# Patient Record
Sex: Female | Born: 1970 | Race: White | Hispanic: No | Marital: Married | Smoking: Current every day smoker
Health system: Southern US, Community
[De-identification: ages and names within clinical notes are randomized; demographics above are authoritative.]

## PROBLEM LIST (undated history)

## (undated) DIAGNOSIS — H8120 Vestibular neuronitis, unspecified ear: Secondary | ICD-10-CM

## (undated) DIAGNOSIS — R112 Nausea with vomiting, unspecified: Secondary | ICD-10-CM

## (undated) DIAGNOSIS — Z9889 Other specified postprocedural states: Secondary | ICD-10-CM

## (undated) DIAGNOSIS — R0981 Nasal congestion: Secondary | ICD-10-CM

## (undated) DIAGNOSIS — R42 Dizziness and giddiness: Secondary | ICD-10-CM

## (undated) HISTORY — PX: APPENDECTOMY: SHX54

## (undated) HISTORY — PX: TONSILLECTOMY: SUR1361

## (undated) HISTORY — PX: KNEE SURGERY: SHX244

## (undated) HISTORY — PX: NASAL SINUS SURGERY: SHX719

## (undated) HISTORY — PX: BREAST ENHANCEMENT SURGERY: SHX7

## (undated) HISTORY — PX: OTHER SURGICAL HISTORY: SHX169

---

## 2012-11-15 ENCOUNTER — Ambulatory Visit
Admission: RE | Admit: 2012-11-15 | Discharge: 2012-11-15 | Disposition: A | Discharge status: Home / Self Care | Payer: BC Managed Care – PPO | Source: Ambulatory Visit | Attending: Family Medicine | Admitting: Family Medicine

## 2012-11-15 ENCOUNTER — Emergency Department (HOSPITAL_COMMUNITY)
Admission: EM | Admit: 2012-11-15 | Discharge: 2012-11-15 | Discharge status: Against Medical Advice | Payer: BC Managed Care – PPO

## 2012-11-15 ENCOUNTER — Other Ambulatory Visit: Payer: Self-pay | Admitting: Family Medicine

## 2012-11-15 DIAGNOSIS — R102 Pelvic and perineal pain: Secondary | ICD-10-CM

## 2012-12-01 ENCOUNTER — Encounter (HOSPITAL_COMMUNITY): Payer: Self-pay | Admitting: Emergency Medicine

## 2012-12-01 ENCOUNTER — Emergency Department (HOSPITAL_COMMUNITY): Payer: BC Managed Care – PPO

## 2012-12-01 ENCOUNTER — Encounter (HOSPITAL_COMMUNITY): Payer: Self-pay

## 2012-12-01 ENCOUNTER — Emergency Department (HOSPITAL_COMMUNITY)
Admission: EM | Admit: 2012-12-01 | Discharge: 2012-12-01 | Disposition: A | Discharge status: Home / Self Care | Payer: BC Managed Care – PPO | Attending: Emergency Medicine | Admitting: Emergency Medicine

## 2012-12-01 ENCOUNTER — Observation Stay (HOSPITAL_COMMUNITY)
Admission: EM | Admit: 2012-12-01 | Discharge: 2012-12-02 | Disposition: A | Discharge status: Home / Self Care | Payer: BC Managed Care – PPO | Attending: Internal Medicine | Admitting: Internal Medicine

## 2012-12-01 DIAGNOSIS — J069 Acute upper respiratory infection, unspecified: Secondary | ICD-10-CM

## 2012-12-01 DIAGNOSIS — H538 Other visual disturbances: Secondary | ICD-10-CM | POA: Insufficient documentation

## 2012-12-01 DIAGNOSIS — Z79899 Other long term (current) drug therapy: Secondary | ICD-10-CM | POA: Insufficient documentation

## 2012-12-01 DIAGNOSIS — R110 Nausea: Secondary | ICD-10-CM | POA: Insufficient documentation

## 2012-12-01 DIAGNOSIS — H8120 Vestibular neuronitis, unspecified ear: Principal | ICD-10-CM | POA: Insufficient documentation

## 2012-12-01 DIAGNOSIS — H81399 Other peripheral vertigo, unspecified ear: Secondary | ICD-10-CM

## 2012-12-01 DIAGNOSIS — H8121 Vestibular neuronitis, right ear: Secondary | ICD-10-CM

## 2012-12-01 DIAGNOSIS — R42 Dizziness and giddiness: Secondary | ICD-10-CM

## 2012-12-01 DIAGNOSIS — F17200 Nicotine dependence, unspecified, uncomplicated: Secondary | ICD-10-CM

## 2012-12-01 HISTORY — DX: Nausea with vomiting, unspecified: Z98.890

## 2012-12-01 HISTORY — DX: Nasal congestion: R09.81

## 2012-12-01 HISTORY — DX: Nausea with vomiting, unspecified: R11.2

## 2012-12-01 HISTORY — DX: Vestibular neuronitis, unspecified ear: H81.20

## 2012-12-01 HISTORY — DX: Dizziness and giddiness: R42

## 2012-12-01 LAB — CBC WITH DIFFERENTIAL/PLATELET
Basophils Absolute: 0 10*3/uL (ref 0.0–0.1)
Basophils Relative: 1 % (ref 0–1)
Hemoglobin: 17 g/dL — ABNORMAL HIGH (ref 12.0–15.0)
MCHC: 34.6 g/dL (ref 30.0–36.0)
Monocytes Relative: 11 % (ref 3–12)
Neutro Abs: 3.9 10*3/uL (ref 1.7–7.7)
Neutrophils Relative %: 71 % (ref 43–77)

## 2012-12-01 LAB — COMPREHENSIVE METABOLIC PANEL
ALT: 20 U/L (ref 0–35)
AST: 19 U/L (ref 0–37)
Albumin: 3.4 g/dL — ABNORMAL LOW (ref 3.5–5.2)
Alkaline Phosphatase: 39 U/L (ref 39–117)
BUN: 13 mg/dL (ref 6–23)
Chloride: 104 mEq/L (ref 96–112)
Potassium: 3.7 mEq/L (ref 3.5–5.1)
Sodium: 139 mEq/L (ref 135–145)
Total Bilirubin: 0.5 mg/dL (ref 0.3–1.2)

## 2012-12-01 LAB — URINALYSIS, ROUTINE W REFLEX MICROSCOPIC
Bilirubin Urine: NEGATIVE
Glucose, UA: NEGATIVE mg/dL
Hgb urine dipstick: NEGATIVE
Ketones, ur: NEGATIVE mg/dL
Protein, ur: NEGATIVE mg/dL

## 2012-12-01 LAB — URINE MICROSCOPIC-ADD ON

## 2012-12-01 MED ORDER — SODIUM CHLORIDE 0.9 % IV SOLN: INTRAVENOUS | Status: DC

## 2012-12-01 MED ORDER — ONDANSETRON HCL 4 MG/2ML IJ SOLN
4.0000 mg | Freq: Once | INTRAMUSCULAR | Status: AC
  Administered 2012-12-01: 4 mg via INTRAVENOUS
  Filled 2012-12-01: qty 2

## 2012-12-01 MED ORDER — LORAZEPAM 2 MG/ML IJ SOLN
1.0000 mg | Freq: Once | INTRAMUSCULAR | Status: AC
  Administered 2012-12-01: 1 mg via INTRAVENOUS
  Filled 2012-12-01: qty 1

## 2012-12-01 MED ORDER — AMOXICILLIN-POT CLAVULANATE 400-57 MG/5ML PO SUSR
10.9000 mL | Freq: Two times a day (BID) | ORAL | Status: DC
Start: 2012-12-01 — End: 2012-12-01
  Administered 2012-12-01: 10.9 mL via ORAL
  Filled 2012-12-01: qty 10.9

## 2012-12-01 MED ORDER — SODIUM CHLORIDE 0.9 % IV SOLN
INTRAVENOUS | Status: AC
  Administered 2012-12-01: 18:00:00 via INTRAVENOUS

## 2012-12-01 MED ORDER — SODIUM CHLORIDE 0.9 % IV BOLUS (SEPSIS)
1000.0000 mL | Freq: Once | INTRAVENOUS | Status: AC
  Administered 2012-12-01: 1000 mL via INTRAVENOUS

## 2012-12-01 MED ORDER — MECLIZINE HCL 50 MG PO TABS: 50.0000 mg | ORAL_TABLET | Freq: Three times a day (TID) | ORAL | Status: AC | PRN

## 2012-12-01 MED ORDER — DIMENHYDRINATE 50 MG PO TABS
50.0000 mg | ORAL_TABLET | Freq: Four times a day (QID) | ORAL | Status: DC
  Administered 2012-12-01 – 2012-12-02 (×4): 50 mg via ORAL
  Filled 2012-12-01 (×7): qty 1

## 2012-12-01 MED ORDER — LORAZEPAM 2 MG/ML IJ SOLN: 1.0000 mg | Freq: Four times a day (QID) | INTRAMUSCULAR | Status: DC | PRN

## 2012-12-01 MED ORDER — ONDANSETRON HCL 4 MG/2ML IJ SOLN: 4.0000 mg | Freq: Three times a day (TID) | INTRAMUSCULAR | Status: DC | PRN

## 2012-12-01 MED ORDER — AMOXICILLIN-POT CLAVULANATE 400-57 MG/5ML PO SUSR
10.9000 mL | Freq: Two times a day (BID) | ORAL | Status: DC
  Administered 2012-12-01 – 2012-12-02 (×2): 10.9 mL via ORAL
  Filled 2012-12-01 (×3): qty 10.9

## 2012-12-01 MED ORDER — ALBUTEROL SULFATE (5 MG/ML) 0.5% IN NEBU: 2.5000 mg | INHALATION_SOLUTION | RESPIRATORY_TRACT | Status: DC | PRN

## 2012-12-01 MED ORDER — ONDANSETRON HCL 4 MG/2ML IJ SOLN: 4.0000 mg | Freq: Four times a day (QID) | INTRAMUSCULAR | Status: DC | PRN

## 2012-12-01 MED ORDER — LORAZEPAM 2 MG/ML IJ SOLN: 1.0000 mg | Freq: Once | INTRAMUSCULAR | Status: DC

## 2012-12-01 MED ORDER — AMOXICILLIN-POT CLAVULANATE 400-57 MG/5ML PO SUSR: 10.9000 mL | Freq: Once | ORAL | Status: DC

## 2012-12-01 MED ORDER — GUAIFENESIN ER 600 MG PO TB12
600.0000 mg | ORAL_TABLET | Freq: Two times a day (BID) | ORAL | Status: DC
  Administered 2012-12-01 – 2012-12-02 (×2): 600 mg via ORAL
  Filled 2012-12-01 (×3): qty 1

## 2012-12-01 MED ORDER — ACETAMINOPHEN 650 MG RE SUPP: 650.0000 mg | Freq: Four times a day (QID) | RECTAL | Status: DC | PRN

## 2012-12-01 MED ORDER — ENOXAPARIN SODIUM 40 MG/0.4ML ~~LOC~~ SOLN
40.0000 mg | SUBCUTANEOUS | Status: DC
  Administered 2012-12-01: 40 mg via SUBCUTANEOUS
  Filled 2012-12-01 (×2): qty 0.4

## 2012-12-01 MED ORDER — ACETAMINOPHEN 325 MG PO TABS: 650.0000 mg | ORAL_TABLET | Freq: Four times a day (QID) | ORAL | Status: DC | PRN

## 2012-12-01 MED ORDER — ONDANSETRON 4 MG PO TBDP: ORAL_TABLET | ORAL | Status: DC

## 2012-12-01 MED ORDER — MECLIZINE HCL 25 MG PO TABS
50.0000 mg | ORAL_TABLET | Freq: Once | ORAL | Status: AC
  Administered 2012-12-01: 50 mg via ORAL
  Filled 2012-12-01: qty 2

## 2012-12-01 MED ORDER — OXYMETAZOLINE HCL 0.05 % NA SOLN
2.0000 | Freq: Every day | NASAL | Status: DC | PRN
  Administered 2012-12-01: 2 via NASAL
  Filled 2012-12-01: qty 15

## 2012-12-01 MED ORDER — PREDNISONE 50 MG PO TABS
60.0000 mg | ORAL_TABLET | Freq: Every day | ORAL | Status: DC
  Administered 2012-12-01 – 2012-12-02 (×2): 60 mg via ORAL
  Filled 2012-12-01 (×4): qty 1

## 2012-12-01 MED ORDER — HYDROCODONE-ACETAMINOPHEN 5-325 MG PO TABS: 1.0000 | ORAL_TABLET | Freq: Four times a day (QID) | ORAL | Status: DC | PRN

## 2012-12-01 MED ORDER — ONDANSETRON HCL 4 MG PO TABS: 4.0000 mg | ORAL_TABLET | Freq: Four times a day (QID) | ORAL | Status: DC | PRN

## 2012-12-01 NOTE — ED Notes (Signed)
Bed: ZO10 Expected date: 12/01/12 Expected time: 7:07 AM Means of arrival: Ambulance Comments: Ear pain/cold sx

## 2012-12-01 NOTE — ED Provider Notes (Signed)
CSN: [id number]     Arrival date & time 12/01/12  4098 History   First MD Initiated Contact with Patient 12/01/12 [phone]     Chief Complaint  Patient presents with  . Dizziness    AT 6AM  . Nausea    AT 6AM   (Consider location/radiation/quality/duration/timing/severity/associated sxs/prior Treatment) HPI Patient with history of left-sided vestibular neuritis presents has had  6 days of URI type symptoms and woke this morning at 5:30 AM with acute onset vertigo. Patient states she was nauseated and felt as if the room were spinning. The dizziness was worse when she turned her head or changed positions. She states she had similar symptoms when she had vestibular neuritis. She has no focal weakness or sensory loss. She complains of right ear discomfort hands decreased hearing in the right ear. She's had a cough has been productive of yellow-green sputum. She has no shortness of breath or chest pain. Should not abdominal pain with vomiting. Patient saw her primary Dr. yesterday morning who told her that she look like she would have a developing right ear infection and start her on antibiotics. She is taking 2 doses of that. Patient was brought in by EMS. She was given IV Zofran with improvement of her nausea. Past Medical History  Diagnosis Date  . Vestibular neuronitis    Past Surgical History  Procedure Laterality Date  . Knee surgery     No family history on file. History  Substance Use Topics  . Smoking status: Not on file  . Smokeless tobacco: Not on file  . Alcohol Use: Not on file   OB History   Grav Para Term Preterm Abortions TAB SAB Ect Mult Living                 Review of Systems  Constitutional: Negative for fever and chills.  HENT: Positive for ear pain, congestion and rhinorrhea. Negative for sore throat, neck pain, neck stiffness and sinus pressure.   Eyes: Positive for visual disturbance. Negative for photophobia.  Respiratory: Positive for cough. Negative for  shortness of breath and wheezing.   Cardiovascular: Negative for chest pain, palpitations and leg swelling.  Gastrointestinal: Positive for nausea. Negative for vomiting, abdominal pain and diarrhea.  Genitourinary: Negative for dysuria.  Musculoskeletal: Negative for myalgias, back pain and gait problem.  Skin: Negative for pallor, rash and wound.  Neurological: Positive for dizziness. Negative for syncope, weakness, light-headedness, numbness and headaches.  All other systems reviewed and are negative.    Allergies  Sulfa antibiotics and Brussels sprouts  Home Medications   Current Outpatient Rx  Name  Route  Sig  Dispense  Refill  . amoxicillin-clavulanate (AUGMENTIN) 875-125 MG per tablet   Oral   Take 1 tablet by mouth 2 (two) times daily. For 10 days         . dimenhyDRINATE (DRAMAMINE) 50 MG tablet   Oral   Take 50 mg by mouth every 8 (eight) hours as needed.         Marland Kitchen guaiFENesin (MUCINEX) 600 MG 12 hr tablet   Oral   Take 600 mg by mouth 2 (two) times daily.         Marland Kitchen HYDROcodone-acetaminophen (NORCO/VICODIN) 5-325 MG per tablet   Oral   Take 1 tablet by mouth every 6 (six) hours as needed for pain.         Marland Kitchen oxymetazoline (AFRIN) 0.05 % nasal spray   Nasal   Place 2 sprays into the nose  daily as needed for congestion (for 3 days).         . promethazine (PHENERGAN) 25 MG tablet   Oral   Take 25 mg by mouth every 6 (six) hours as needed for nausea.         . meclizine (ANTIVERT) 50 MG tablet   Oral   Take 1 tablet (50 mg total) by mouth 3 (three) times daily as needed for dizziness.   30 tablet   0   . ondansetron (ZOFRAN ODT) 4 MG disintegrating tablet      4mg  ODT q4 hours prn nausea/vomit   8 tablet   0    BP 138/82  Pulse 89  Temp(Src) 98.2 F (36.8 C) (Oral)  Resp 18  SpO2 95% Physical Exam  Nursing note and vitals reviewed. Constitutional: She is oriented to person, place, and time. She appears well-developed and  well-nourished. No distress.  HENT:  Head: Normocephalic and atraumatic.  Mouth/Throat: Oropharynx is clear and moist. No oropharyngeal exudate.  No sinus tenderness to palpation. No mastoid tenderness. Patient has a bulging erythematous right TM compared to left.  Eyes: EOM are normal. Pupils are equal, round, and reactive to light.  Patient has a fatigable horizontal nystagmus. It is exacerbated by change in head position.  Neck: Normal range of motion. Neck supple.  Patient has no nuchal rigidity   Cardiovascular: Normal rate and regular rhythm.   Pulmonary/Chest: Effort normal and breath sounds normal. No respiratory distress. She has no wheezes. She has no rales. She exhibits no tenderness.  Abdominal: Soft. Bowel sounds are normal. She exhibits no distension and no mass. There is no tenderness. There is no rebound and no guarding.  Musculoskeletal: Normal range of motion. She exhibits no edema and no tenderness.  No calf swelling or tenderness.  Neurological: She is alert and oriented to person, place, and time.  Patient is alert and oriented x3 with clear, goal oriented speech. Patient has 5/5 motor in all extremities. Sensation is intact to light touch. Bilateral finger-to-nose is normal with no signs of dysmetria.    Skin: Skin is warm and dry. No rash noted. No erythema.  Psychiatric: She has a normal mood and affect. Her behavior is normal.    ED Course  Procedures (including critical care time) Labs Review Labs Reviewed  CBC WITH DIFFERENTIAL - Abnormal; Notable for the following:    RBC 5.20 (*)    Hemoglobin 17.0 (*)    HCT 49.2 (*)    All other components within normal limits  COMPREHENSIVE METABOLIC PANEL - Abnormal; Notable for the following:    Albumin 3.4 (*)    All other components within normal limits  URINALYSIS, ROUTINE W REFLEX MICROSCOPIC - Abnormal; Notable for the following:    APPearance CLOUDY (*)    Leukocytes, UA TRACE (*)    All other components  within normal limits  URINE MICROSCOPIC-ADD ON   Imaging Review Dg Chest 2 View  12/01/2012   *RADIOLOGY REPORT*  Clinical Data: Productive cough.  CHEST - 2 VIEW  Comparison: 11/21/2016 2014  Findings: The heart size and pulmonary vascularity are normal. There is slight linear atelectasis or scarring at the right lung base.  No acute osseous abnormality.  IMPRESSION: Minimal linear atelectasis or scarring at the right base posteriorly.   Original Report Authenticated By: Francene Boyers, M.D.    MDM  Given the patient's history vestibular neuritis, recent URI symptoms, normal neurologic exam, Ibelieve this likely represents an episode of peripheral  vertigo. We'll screen with labs, we'll get a chest x-ray to rule out pneumonia, and treat symptomatically.  Patient states she is feeling much better after the medications. She's had no further nausea and emergency department.  Patient condition proven emergency department. And related to the bathroom with mild assistance from her husband she states that she felt much more steady. Is agreeable with plan to discharge home with by mouth antibiotics and for vertigo the medication. Patient advised to followup with the ENT. Return precautions been given.  Loren Racer, MD 12/01/12 1036

## 2012-12-01 NOTE — ED Notes (Signed)
MD at bedside. 

## 2012-12-01 NOTE — ED Provider Notes (Signed)
CSN: [id number]     Arrival date & time 12/01/12  1402 History   First MD Initiated Contact with Patient 12/01/12 1424     Chief Complaint  Patient presents with  . Dizziness   (Consider location/radiation/quality/duration/timing/severity/associated sxs/prior Treatment) HPI Patient was seen by me earlier today for vertiginous symptoms that started acutely this morning. Her symptoms and significantly improved and she was ambulating in the emergency department. She was discharged home with ODT Zofran and meclizine. Patient states that at 12 a clock should she had again another sudden episode of vertigo with severe nausea. She took the medication as prescribed and stated "it did nothing". Patient has no neurologic complaints. Past Medical History  Diagnosis Date  . Vestibular neuronitis    Past Surgical History  Procedure Laterality Date  . Knee surgery     No family history on file. History  Substance Use Topics  . Smoking status: Current Every Day Smoker  . Smokeless tobacco: Not on file  . Alcohol Use: Not on file   OB History   Grav Para Term Preterm Abortions TAB SAB Ect Mult Living                 Review of Systems  Constitutional: Negative for fever and chills.  HENT: Positive for hearing loss and ear pain. Negative for sore throat and neck pain.   Eyes: Positive for visual disturbance.  Respiratory: Negative for shortness of breath.   Cardiovascular: Negative for chest pain.  Gastrointestinal: Positive for nausea and vomiting. Negative for abdominal pain, diarrhea and constipation.  Musculoskeletal: Negative for myalgias, back pain, joint swelling, arthralgias and gait problem.  Skin: Negative for pallor, rash and wound.  Neurological: Positive for dizziness. Negative for weakness, light-headedness, numbness and headaches.  All other systems reviewed and are negative.    Allergies  Sulfa antibiotics and Brussels sprouts  Home Medications   Current Outpatient Rx   Name  Route  Sig  Dispense  Refill  . amoxicillin-clavulanate (AUGMENTIN) 875-125 MG per tablet   Oral   Take 1 tablet by mouth 2 (two) times daily. For 10 days         . dimenhyDRINATE (DRAMAMINE) 50 MG tablet   Oral   Take 50 mg by mouth every 8 (eight) hours as needed.         Marland Kitchen guaiFENesin (MUCINEX) 600 MG 12 hr tablet   Oral   Take 600 mg by mouth 2 (two) times daily.         Marland Kitchen HYDROcodone-acetaminophen (NORCO/VICODIN) 5-325 MG per tablet   Oral   Take 1 tablet by mouth every 6 (six) hours as needed for pain.         . meclizine (ANTIVERT) 50 MG tablet   Oral   Take 1 tablet (50 mg total) by mouth 3 (three) times daily as needed for dizziness.   30 tablet   0   . ondansetron (ZOFRAN ODT) 4 MG disintegrating tablet      4mg  ODT q4 hours prn nausea/vomit   8 tablet   0   . oxymetazoline (AFRIN) 0.05 % nasal spray   Nasal   Place 2 sprays into the nose daily as needed for congestion (for 3 days).         . promethazine (PHENERGAN) 25 MG tablet   Oral   Take 25 mg by mouth every 6 (six) hours as needed for nausea.          BP 121/74  Pulse  84  Temp(Src) 98.8 F (37.1 C) (Oral)  Resp 18  SpO2 100% Physical Exam  Nursing note and vitals reviewed. Constitutional: She is oriented to person, place, and time. She appears well-developed and well-nourished.  Patient is nauseated  HENT:  Head: Normocephalic and atraumatic.  Mouth/Throat: Oropharynx is clear and moist.  Eyes: EOM are normal. Pupils are equal, round, and reactive to light.  Fatigable horizontal nystagmus worsened with position change.  Neck: Normal range of motion. Neck supple.  No meningeal symptoms  Cardiovascular: Normal rate and regular rhythm.   Pulmonary/Chest: Effort normal and breath sounds normal. No respiratory distress. She has no wheezes. She has no rales.  Abdominal: Soft. Bowel sounds are normal. She exhibits no distension and no mass. There is no tenderness. There is no  rebound and no guarding.  Musculoskeletal: Normal range of motion. She exhibits no edema and no tenderness.  Neurological: She is alert and oriented to person, place, and time.  Patient is alert and oriented x3 with clear, goal oriented speech. Patient has 5/5 motor in all extremities. Sensation is intact to light touch. Bilateral finger-to-nose is normal with no signs of dysmetria.   Skin: Skin is warm and dry. No rash noted. No erythema.  Psychiatric: She has a normal mood and affect. Her behavior is normal.    ED Course  Procedures (including critical care time) Labs Review Labs Reviewed - No data to display Imaging Review Dg Chest 2 View  12/01/2012   *RADIOLOGY REPORT*  Clinical Data: Productive cough.  CHEST - 2 VIEW  Comparison: 11/21/2016 2014  Findings: The heart size and pulmonary vascularity are normal. There is slight linear atelectasis or scarring at the right lung base.  No acute osseous abnormality.  IMPRESSION: Minimal linear atelectasis or scarring at the right base posteriorly.   Original Report Authenticated By: Francene Boyers, M.D.    MDM  Place IV give IV medications and fluids. The or MRI given persistent symptoms. Discussed with [facility] for admission.  [facility] to admit to medical floor. MRI is pending.  Loren Racer, MD 12/01/12 [phone]

## 2012-12-01 NOTE — Progress Notes (Signed)
pcp is elizabeth dewey per pt epic updated

## 2012-12-01 NOTE — Progress Notes (Signed)
Received call from [facility] at [facility] aid pharmacy regarding patient's discharge prescription of meclizine. Reports that they only carry meclizine 12.5 mg and 25 mg tablets but prescription was for 50 mg. EDCM spoke with J.Palmer, PA in fast track who changed prescription to meclizine to 25mg  2 tablets by mouth up to three times a day as needed. Dispense 30 pills. Information relayed to [place] at [facility]. No further needs identified.

## 2012-12-01 NOTE — ED Notes (Signed)
Patient transported to MRI 

## 2012-12-01 NOTE — ED Notes (Signed)
Report given to [place] on 5 east. Will see if MRI staff will tx to the floor once complete.

## 2012-12-01 NOTE — ED Notes (Signed)
Per pt, left ED this am with same symptoms-went home and states meds not working at all-took two doses of meclazine with no relief

## 2012-12-01 NOTE — ED Notes (Signed)
MRI refused to tx pt to the floor. Will wait for pt to arrive back in her room and then transfer to 1513.

## 2012-12-01 NOTE — ED Notes (Signed)
Per [facility] pt c/o of vestibular issues to the left side (ear) in January. Antibiotics given.  HX of the same complaints today. Nausea relieved with zofran IVP. Recent left knee surgery -

## 2012-12-01 NOTE — H&P (Addendum)
[facility] History and Physical  Donna Copeland RUE:[id number] DOB: 04-10-1970 DOA: 12/01/2012   Referring physician: Dr. Loren Racer, EDP PCP: Dr. Maryelizabeth Rowan Outpatient Specialists:  1. Orthopedics: Dr. Gean Birchwood  Chief Complaint: Dizziness & nausea  HPI: Donna Copeland is a 42 y.o. female with history of left vestibular neuronitis, tobacco abuse, recent left knee arthroscopic surgery, presented to the ED for the second time on 12/01/12 with complaints of severe dizziness/vertigo, unsteady gait and nausea. She has prior history vestibular neuronitis of left ear in January 2014 when she had similar presentation and was hospitalized in [place]. She had prolonged rehabilitation post discharge and her left ear continues to be "sensitive". During subsequent followup with ENT, she was informed that she had "permanent damage" with her inner ear apparatus. 5 days ago she was exposed to her spouse and son with "cold symptoms". She subsequently started having nasal congestion, cough with intermittent purulent sputum and dyspnea but no fever or chills. She also started experiencing stuffiness of both ears. She was seen by her PCP 2 days ago and started on oral Augmentin for "inflamed membranes". She was not able to tolerate the large Augmentin pills. Last night while in the shower, she abruptly started having vertigo and everything around her started spinning rapidly, associated with unsteady gait, stuffy right ear with occasional mild pain but no real hearing deficit and extreme nausea. No falls. No vomiting. She states that this presentation was very similar to her prior episode in January. She was seen in the ED and discharged home on Dramamine, continued Augmentin and Phenergan. She however returned with persistent severe symptoms and the hospitalist were requested to admit for further management. In the ED, CBC, CMP, UA were unremarkable. MRI brain shows features of  sinusitis.   Review of Systems: All systems reviewed and apart from history of presenting illness, are negative   Past Medical History  Diagnosis Date  . Vestibular neuronitis   . Sinus congestion   . Vertigo   . PONV (postoperative nausea and vomiting)    Past Surgical History  Procedure Laterality Date  . Knee surgery    . Left knee arthroscopy    . Nasal sinus surgery      march 2014  . Appendectomy    . Tonsillectomy    . Breast enhancement surgery     Social History:  reports that she has been smoking Cigarettes.  She has been smoking about 0.50 packs per day. She has never used smokeless tobacco. She reports that she drinks about 2.4 ounces of alcohol per week. Her drug history is not on file. Married. Independent of activities of daily living.  Allergies  Allergen Reactions  . Sulfa Antibiotics Hives, Shortness Of Breath and Nausea And Vomiting  . Brussels Sprouts [Brassica Oleracea Italica] Nausea And Vomiting    Family History  Problem Relation Age of Onset  . Adopted: Yes   since she was adopted at age 65, she does not know her family history.  Prior to Admission medications   Medication Sig Start Date End Date Taking? Authorizing Provider  amoxicillin-clavulanate (AUGMENTIN) 875-125 MG per tablet Take 1 tablet by mouth 2 (two) times daily. For 10 days   Yes Historical Provider, MD  dimenhyDRINATE (DRAMAMINE) 50 MG tablet Take 50 mg by mouth every 8 (eight) hours as needed.   Yes Historical Provider, MD  guaiFENesin (MUCINEX) 600 MG 12 hr tablet Take 600 mg by mouth 2 (two) times daily.   Yes Historical Provider, MD  HYDROcodone-acetaminophen (NORCO/VICODIN) 5-325 MG per tablet Take 1 tablet by mouth every 6 (six) hours as needed for pain.   Yes Historical Provider, MD  meclizine (ANTIVERT) 50 MG tablet Take 1 tablet (50 mg total) by mouth 3 (three) times daily as needed for dizziness. 12/01/12  Yes Loren Racer, MD  ondansetron (ZOFRAN-ODT) 4 MG disintegrating  tablet Take 4 mg by mouth every 8 (eight) hours as needed for nausea (or vomiting).   Yes Historical Provider, MD  oxymetazoline (AFRIN) 0.05 % nasal spray Place 2 sprays into the nose daily as needed for congestion (for 3 days).   Yes Historical Provider, MD  promethazine (PHENERGAN) 25 MG tablet Take 25 mg by mouth every 6 (six) hours as needed for nausea.   Yes Historical Provider, MD   Physical Exam: Filed Vitals:   12/01/12 1413 12/01/12 1530  BP: 121/74 128/77  Pulse: 84 96  Temp: 98.8 F (37.1 C) 98.5 F (36.9 C)  TempSrc: Oral Oral  Resp: 18 20  SpO2: 100% 98%     General exam: Moderately built and nourished female patient, lying comfortably supine in bed in no obvious distress.  Head, eyes and ENT: Nontraumatic and normocephalic. Pupils equally reacting to light and accommodation. Oral mucosa slightly dry. Mild hyperemia of right tympanic membrane which appears dull. Mild cerumen left auditory canal.? Mild horizontal nystagmus to right gaze.  Neck: Supple. No JVD, carotid bruit or thyromegaly.  Lymphatics: No lymphadenopathy.  Respiratory system: Clear to auscultation. No increased work of breathing.  Cardiovascular system: S1 and S2 heard, RRR. No JVD, murmurs, gallops, clicks or pedal edema.  Gastrointestinal system: Abdomen is nondistended, soft and nontender. Normal bowel sounds heard. No organomegaly or masses appreciated.  Central nervous system: Alert and oriented. No focal neurological deficits.  Extremities: Symmetric 5 x 5 power. Peripheral pulses symmetrically felt.  Skin: No rashes or acute findings.  Musculoskeletal system: Negative exam.  Psychiatry: Pleasant and cooperative.   Labs on Admission:  Basic Metabolic Panel:  Recent Labs Lab 12/01/12 0800  NA 139  K 3.7  CL 104  CO2 26  GLUCOSE 96  BUN 13  CREATININE 0.79  CALCIUM 9.1   Liver Function Tests:  Recent Labs Lab 12/01/12 0800  AST 19  ALT 20  ALKPHOS 39  BILITOT 0.5   PROT 6.9  ALBUMIN 3.4*   No results found for this basename: LIPASE, AMYLASE,  in the last 168 hours No results found for this basename: AMMONIA,  in the last 168 hours CBC:  Recent Labs Lab 12/01/12 0800  WBC 5.6  NEUTROABS 3.9  HGB 17.0*  HCT 49.2*  MCV 94.6  PLT 174   Cardiac Enzymes: No results found for this basename: CKTOTAL, CKMB, CKMBINDEX, TROPONINI,  in the last 168 hours  BNP (last 3 results) No results found for this basename: PROBNP,  in the last 8760 hours CBG: No results found for this basename: GLUCAP,  in the last 168 hours  Radiological Exams on Admission: Dg Chest 2 View  12/01/2012   *RADIOLOGY REPORT*  Clinical Data: Productive cough.  CHEST - 2 VIEW  Comparison: 11/21/2016 2014  Findings: The heart size and pulmonary vascularity are normal. There is slight linear atelectasis or scarring at the right lung base.  No acute osseous abnormality.  IMPRESSION: Minimal linear atelectasis or scarring at the right base posteriorly.   Original Report Authenticated By: Francene Boyers, M.D.   Mr Brain Wo Contrast  12/01/2012   CLINICAL DATA:  42 year old female  with dizziness, nausea, headaches.  EXAM: MRI HEAD WITHOUT CONTRAST  TECHNIQUE: Multiplanar, multisequence MR imaging was performed. No intravenous contrast was administered.  COMPARISON:  None.  FINDINGS: Cerebral volume is normal. No restricted diffusion to suggest acute infarction. No midline shift, mass effect, evidence of mass lesion, ventriculomegaly, extra-axial collection or acute intracranial hemorrhage. Cervicomedullary junction and pituitary are within normal limits. Negative visualized cervical spine. Major intracranial vascular flow voids are preserved. Wallace Cullens and white matter signal is within normal limits throughout the brain.  Visualized bone marrow signal is within normal limits. Small maxillary sinus mucous retention cysts. Moderate ethmoid and sphenoid sinus mucosal thickening. Left sphenoid air-fluid  level. Frontal sinuses are relatively clear. Trace bilateral mastoid fluid. Negative visualized nasopharynx. Otherwise grossly normal visualized internal auditory structures. Negative scalp soft tissues.  IMPRESSION: 1. Normal non contrast MRI appearance of the brain.  2. Moderate paranasal sinus and mild mastoid inflammatory changes.   Electronically Signed   By: Augusto Gamble M.D.   On: 12/01/2012 15:47     Assessment/Plan Active Problems:   Vertigo   Tobacco use disorder   Vestibular neuronitis   Right Vestibular neuronitis/vertigo/Possible sinusitis - MRI brain without acute changes. - Treat with oral prednisone taper for 10 days, continue oral Augmentin but in suspension form, dimenhydrinate scheduled & Ativan when necessary. When necessary IV Zofran for nausea. Brief IV fluid hydration. - OT evaluation - Discussed with ENT on call Dr. Christia Reading who agrees with above management and he will be happy to see patient as an OP for further evaluation. Discussed with spouse who is agreeable to this plan.  Tobacco abuse - Cessation counseled.    Code Status: Full   Family Communication: Discussed with spouse at bedside.  Disposition Plan: Home when medically stable.   Time spent: 55 minutes  [facility] Pager [phone]  If 7PM-7AM, please contact night-coverage [email or web address] Password [facility] 12/01/2012, 5:39 PM

## 2012-12-02 MED ORDER — AMOXICILLIN-POT CLAVULANATE 400-57 MG/5ML PO SUSR: 10.9000 mL | Freq: Two times a day (BID) | ORAL | Status: AC

## 2012-12-02 MED ORDER — GUAIFENESIN ER 600 MG PO TB12: 600.0000 mg | ORAL_TABLET | Freq: Two times a day (BID) | ORAL | Status: AC

## 2012-12-02 MED ORDER — PREDNISONE 20 MG PO TABS: ORAL_TABLET | ORAL | Status: AC

## 2012-12-02 NOTE — Progress Notes (Signed)
OT Note  Patient Details Name: Nekesha Font MRN: [id number] DOB: 11/12/1970        Pt states she is being DCed today.  Recomend pt follow up at [facility]. Pt agreeable.   Pt states she was also having PT for her knee.  Pt open to also having PT for her vestibular issue- but would like to go to same place.    Alba Cory 12/02/2012, 11:33 AM

## 2012-12-02 NOTE — Discharge Summary (Signed)
Physician Discharge Summary  Donna Copeland ZOX:[id number] DOB: 10-18-1970 DOA: 12/01/2012  PCP: Provider Not In System  Admit date: 12/01/2012 Discharge date: 12/02/2012  Time spent: 35 minutes  Recommendations for Outpatient Follow-up:  1. Vestibular PT eval.  2. Follow up with ENT 3. Follow up with PCP in 1 -2 weeks.   Discharge Diagnoses:  Active Problems:   Vertigo   Tobacco use disorder   Vestibular neuronitis   Discharge Condition: improved  Diet recommendation: regular  Filed Weights   12/01/12 1900  Weight: 60.102 kg (132 lb 8 oz)    History of present illness:  Donna Copeland is a 42 y.o. female with history of left vestibular neuronitis, tobacco abuse, recent left knee arthroscopic surgery, presented to the ED for the second time on 12/01/12 with complaints of severe dizziness/vertigo, unsteady gait and nausea. She has prior history vestibular neuronitis of left ear in January 2014 when she had similar presentation and was hospitalized in [place]. She had prolonged rehabilitation post discharge and her left ear continues to be "sensitive". During subsequent followup with ENT, she was informed that she had "permanent damage" with her inner ear apparatus. 5 days ago she was exposed to her spouse and son with "cold symptoms". She subsequently started having nasal congestion, cough with intermittent purulent sputum and dyspnea but no fever or chills. She also started experiencing stuffiness of both ears. She was seen by her PCP 2 days ago and started on oral Augmentin for "inflamed membranes". She was not able to tolerate the large Augmentin pills. She was seen in the ED and discharged home on Dramamine, continued Augmentin and Phenergan. She however returned with persistent severe symptoms and the hospitalist were requested to admit for further management. In the ED, CBC, CMP, UA were unremarkable. MRI brain shows features of sinusitis. Her symptoms improved within 24 hours  on prednisone and liquid augmentin. PT vestibular eval ordered and recommended outpatient therapy. She is being discharged on tapering dose of prednisone.     Hospital Course:  Right Vestibular neuronitis/vertigo/Possible sinusitis  - MRI brain without acute changes.  - Treat with oral prednisone taper for 10 days, continue oral Augmentin but in suspension form, dimenhydrinate scheduled & Ativan when necessary.- OT evaluation recommended outpatient follow up.  - Discussed with ENT on call Dr. Christia Reading who agrees with above management and he will be happy to see patient as an OP for further evaluation. Discussed with spouse who is agreeable to this plan. And plan to see the patient Monday afternoon.  Tobacco abuse  - Cessation counseled.  Procedures:  none  Consultations:  none  Discharge Exam: Filed Vitals:   12/02/12 1305  BP: 130/84  Pulse: 90  Temp: 98.2 F (36.8 C)  Resp: 18    General: alert afebrile comfortable Cardiovascular: s1s2 Respiratory: ctab  Discharge Instructions  Discharge Orders   Future Orders Complete By Expires   Discharge instructions  As directed    Comments:     Follow up with Dr Jenne Pane on Monday.   Increase activity slowly  As directed        Medication List    STOP taking these medications       amoxicillin-clavulanate 875-125 MG per tablet  Commonly known as:  AUGMENTIN  Replaced by:  amoxicillin-clavulanate 400-57 MG/5ML suspension      TAKE these medications       amoxicillin-clavulanate 400-57 MG/5ML suspension  Commonly known as:  AUGMENTIN  Take 10.9 mLs by mouth every 12 (twelve)  hours.     DRAMAMINE 50 MG tablet  Generic drug:  dimenhyDRINATE  Take 50 mg by mouth every 8 (eight) hours as needed.     guaiFENesin 600 MG 12 hr tablet  Commonly known as:  MUCINEX  Take 1 tablet (600 mg total) by mouth 2 (two) times daily.     HYDROcodone-acetaminophen 5-325 MG per tablet  Commonly known as:  NORCO/VICODIN  Take 1  tablet by mouth every 6 (six) hours as needed for pain.     meclizine 50 MG tablet  Commonly known as:  ANTIVERT  Take 1 tablet (50 mg total) by mouth 3 (three) times daily as needed for dizziness.     ondansetron 4 MG disintegrating tablet  Commonly known as:  ZOFRAN-ODT  Take 4 mg by mouth every 8 (eight) hours as needed for nausea (or vomiting).     oxymetazoline 0.05 % nasal spray  Commonly known as:  AFRIN  Place 2 sprays into the nose daily as needed for congestion (for 3 days).     predniSONE 20 MG tablet  Commonly known as:  DELTASONE  - Prednisone 60 mg daily for 4 days followed by  - Prednisone 40 mg daily for 1 days followed by  - Prednisone 30 mg daily for 1 day followed by  - Prednisone 20 mg daily for 1 day followed by  - Prednisone 10 mg daily for 1 day followed by   - Prednisone 5 mg daily for 1 day.     promethazine 25 MG tablet  Commonly known as:  PHENERGAN  Take 25 mg by mouth every 6 (six) hours as needed for nausea.       Allergies  Allergen Reactions  . Sulfa Antibiotics Hives, Shortness Of Breath and Nausea And Vomiting  . Brussels Sprouts [Brassica Oleracea Italica] Nausea And Vomiting      The results of significant diagnostics from this hospitalization (including imaging, microbiology, ancillary and laboratory) are listed below for reference.    Significant Diagnostic Studies: Dg Chest 2 View  12/01/2012   *RADIOLOGY REPORT*  Clinical Data: Productive cough.  CHEST - 2 VIEW  Comparison: 11/21/2016 2014  Findings: The heart size and pulmonary vascularity are normal. There is slight linear atelectasis or scarring at the right lung base.  No acute osseous abnormality.  IMPRESSION: Minimal linear atelectasis or scarring at the right base posteriorly.   Original Report Authenticated By: Francene Boyers, M.D.   Mr Brain Wo Contrast  12/01/2012   CLINICAL DATA:  42 year old female with dizziness, nausea, headaches.  EXAM: MRI HEAD WITHOUT CONTRAST   TECHNIQUE: Multiplanar, multisequence MR imaging was performed. No intravenous contrast was administered.  COMPARISON:  None.  FINDINGS: Cerebral volume is normal. No restricted diffusion to suggest acute infarction. No midline shift, mass effect, evidence of mass lesion, ventriculomegaly, extra-axial collection or acute intracranial hemorrhage. Cervicomedullary junction and pituitary are within normal limits. Negative visualized cervical spine. Major intracranial vascular flow voids are preserved. Wallace Cullens and white matter signal is within normal limits throughout the brain.  Visualized bone marrow signal is within normal limits. Small maxillary sinus mucous retention cysts. Moderate ethmoid and sphenoid sinus mucosal thickening. Left sphenoid air-fluid level. Frontal sinuses are relatively clear. Trace bilateral mastoid fluid. Negative visualized nasopharynx. Otherwise grossly normal visualized internal auditory structures. Negative scalp soft tissues.  IMPRESSION: 1. Normal non contrast MRI appearance of the brain.  2. Moderate paranasal sinus and mild mastoid inflammatory changes.   Electronically Signed   By: Nedra Hai  Margo Aye M.D.   On: 12/01/2012 15:47   US Transvaginal Non-ob  11/29/2012   CLINICAL DATA:  Progressive chronic pelvic pain. Postmenopausal female on hormone replacement therapy.  EXAM: TRANSABDOMINAL AND TRANSVAGINAL ULTRASOUND OF PELVIS  TECHNIQUE: Both transabdominal and transvaginal ultrasound examinations of the pelvis were performed. Transabdominal technique was performed for global imaging of the pelvis including uterus, ovaries, adnexal regions, and pelvic cul-de-sac. It was necessary to proceed with endovaginal exam following the transabdominal exam to visualize the endometrium and ovaries .  COMPARISON:  None  FINDINGS: Uterus  Measurements: 11.7 by 5.8 by 6.7 cm. Retroverted. Several tiny uterine fibroids are identified, largest measuring 1.6 cm.  Endometrium  Thickness: 3 mm No focal  abnormality visualized.  Right ovary  Measurements: 2.4 x 1.9 x 2.3 cm. Normal postmenopausal appearance.  Left ovary  Measurements: 2.6 x 0.9 x 1.2 cm. Normal postmenopausal appearance.  Other findings  No free fluid.  IMPRESSION: Retroverted uterus, with several tiny uterine fibroids, largest measuring 1.6 cm.  Normal postmenopausal ovaries. No adnexal mass or free fluid identified.   Electronically Signed   By: Myles Rosenthal   On: 11/29/2012 09:27   US Pelvis Complete  11/15/2012   CLINICAL DATA:  Progressive chronic pelvic pain. Postmenopausal female on hormone replacement therapy.  EXAM: TRANSABDOMINAL AND TRANSVAGINAL ULTRASOUND OF PELVIS  TECHNIQUE: Both transabdominal and transvaginal ultrasound examinations of the pelvis were performed. Transabdominal technique was performed for global imaging of the pelvis including uterus, ovaries, adnexal regions, and pelvic cul-de-sac. It was necessary to proceed with endovaginal exam following the transabdominal exam to visualize the endometrium and ovaries.  COMPARISON:  None  FINDINGS: Uterus  Measurements: 11.7 by 5.8 by 6.7 cm. Retroverted. Several tiny uterine fibroids are identified, largest measuring 1.6 cm.  Endometrium  Thickness: 3 mm  No focal abnormality visualized.  Right ovary  Measurements: 2.4 x 1.9 x 2.3 cm.  Normal postmenopausal appearance.  Left ovary  Measurements: 2.6 x 0.9 x 1.2 cm. Normal postmenopausal appearance.  Other findings  No free fluid.  IMPRESSION: Retroverted uterus, with several tiny uterine fibroids, largest measuring 1.6 cm.  Normal postmenopausal ovaries. No adnexal mass or free fluid identified.   Electronically Signed   By: Myles Rosenthal   On: 11/15/2012 13:45    Microbiology: No results found for this or any previous visit (from the past 240 hour(s)).   Labs: Basic Metabolic Panel:  Recent Labs Lab 12/01/12 0800  NA 139  K 3.7  CL 104  CO2 26  GLUCOSE 96  BUN 13  CREATININE 0.79  CALCIUM 9.1   Liver Function  Tests:  Recent Labs Lab 12/01/12 0800  AST 19  ALT 20  ALKPHOS 39  BILITOT 0.5  PROT 6.9  ALBUMIN 3.4*   No results found for this basename: LIPASE, AMYLASE,  in the last 168 hours No results found for this basename: AMMONIA,  in the last 168 hours CBC:  Recent Labs Lab 12/01/12 0800  WBC 5.6  NEUTROABS 3.9  HGB 17.0*  HCT 49.2*  MCV 94.6  PLT 174   Cardiac Enzymes: No results found for this basename: CKTOTAL, CKMB, CKMBINDEX, TROPONINI,  in the last 168 hours BNP: BNP (last 3 results) No results found for this basename: PROBNP,  in the last 8760 hours CBG: No results found for this basename: GLUCAP,  in the last 168 hours     Signed:  Osiel Stick  [facility] 12/02/2012, 2:21 PM

## 2012-12-02 NOTE — Progress Notes (Signed)
PTNote  Patient Details Name: Donna Copeland MRN: [id number] DOB: [birth year]  Chart reviewed and interviewed pt. At this time no further vestibular assessment or or treatment needed. Pt reports no increase in symptoms with rolling or movement, just persistent when she is acutely having symptoms. At this time she reports she is feeling much better and was able to turn head and talk with no reports of dizziness. With this neuritis , she is to begin gaze stabilization exercises when symptoms begin to calm down and she is independent with these and fully aware of how to preform . She showed me how to do them and how to progress them correctly. I did let her know and gave her a handout of the vestibular OP PT program that specializes in treatment and assessment of vestibular problems if she and her MD feels she would need to pursue this later.      No further PT needed at this time. Thank you for the referral, call if any questions.    Marella Bile 12/02/2012, 1:13 PM  Marella Bile, PT Pager: [phone] 12/02/2012

## 2016-02-13 ENCOUNTER — Other Ambulatory Visit: Payer: Self-pay | Admitting: Family Medicine

## 2016-02-13 DIAGNOSIS — Z1231 Encounter for screening mammogram for malignant neoplasm of breast: Secondary | ICD-10-CM

## 2016-03-12 ENCOUNTER — Ambulatory Visit: Payer: Self-pay

## 2017-01-28 ENCOUNTER — Other Ambulatory Visit: Payer: Self-pay

## 2017-01-28 ENCOUNTER — Encounter (HOSPITAL_COMMUNITY): Payer: Self-pay | Admitting: Emergency Medicine

## 2017-01-28 ENCOUNTER — Emergency Department (HOSPITAL_COMMUNITY)
Admission: EM | Admit: 2017-01-28 | Discharge: 2017-01-28 | Disposition: A | Discharge status: Home / Self Care | Payer: 59 | Attending: Emergency Medicine | Admitting: Emergency Medicine

## 2017-01-28 ENCOUNTER — Emergency Department (HOSPITAL_COMMUNITY): Payer: 59

## 2017-01-28 DIAGNOSIS — Z79899 Other long term (current) drug therapy: Secondary | ICD-10-CM | POA: Insufficient documentation

## 2017-01-28 DIAGNOSIS — F17210 Nicotine dependence, cigarettes, uncomplicated: Secondary | ICD-10-CM | POA: Insufficient documentation

## 2017-01-28 DIAGNOSIS — R1011 Right upper quadrant pain: Secondary | ICD-10-CM

## 2017-01-28 LAB — CBC
HCT: 49.6 % — ABNORMAL HIGH (ref 36.0–46.0)
Hemoglobin: 16.7 g/dL — ABNORMAL HIGH (ref 12.0–15.0)
MCH: 31.9 pg (ref 26.0–34.0)
MCHC: 33.7 g/dL (ref 30.0–36.0)
MCV: 94.8 fL (ref 78.0–100.0)
Platelets: 188 10*3/uL (ref 150–400)
RBC: 5.23 MIL/uL — ABNORMAL HIGH (ref 3.87–5.11)
RDW: 13.1 % (ref 11.5–15.5)
WBC: 6 10*3/uL (ref 4.0–10.5)

## 2017-01-28 LAB — URINALYSIS, ROUTINE W REFLEX MICROSCOPIC
Bacteria, UA: NONE SEEN
Bilirubin Urine: NEGATIVE
Glucose, UA: NEGATIVE mg/dL
Hgb urine dipstick: NEGATIVE
Ketones, ur: NEGATIVE mg/dL
Nitrite: NEGATIVE
PROTEIN: NEGATIVE mg/dL
SPECIFIC GRAVITY, URINE: 1.018 (ref 1.005–1.030)
pH: 6 (ref 5.0–8.0)

## 2017-01-28 LAB — COMPREHENSIVE METABOLIC PANEL
ALT: 17 U/L (ref 14–54)
AST: 21 U/L (ref 15–41)
Albumin: 4.3 g/dL (ref 3.5–5.0)
Alkaline Phosphatase: 45 U/L (ref 38–126)
Anion gap: 8 (ref 5–15)
BUN: 19 mg/dL (ref 6–20)
CHLORIDE: 108 mmol/L (ref 101–111)
CO2: 27 mmol/L (ref 22–32)
CREATININE: 1.18 mg/dL — AB (ref 0.44–1.00)
Calcium: 9.6 mg/dL (ref 8.9–10.3)
GFR calc Af Amer: >60 mL/min (ref >60)
GFR, EST NON AFRICAN AMERICAN: 54 mL/min — AB (ref >60)
GLUCOSE: 148 mg/dL — AB (ref 65–99)
Potassium: 4.8 mmol/L (ref 3.5–5.1)
Sodium: 143 mmol/L (ref 135–145)
Total Bilirubin: 0.5 mg/dL (ref 0.3–1.2)
Total Protein: 7 g/dL (ref 6.5–8.1)

## 2017-01-28 LAB — LIPASE, BLOOD: LIPASE: 39 U/L (ref 11–51)

## 2017-01-28 LAB — I-STAT BETA HCG BLOOD, ED (MC, WL, AP ONLY)

## 2017-01-28 NOTE — Discharge Instructions (Signed)

## 2017-01-28 NOTE — ED Notes (Signed)
Asked patient if she could use the restroom to give a urine specimen and patient stated "Not now. I just peed." Will try to collect urine specimen within the hour.

## 2017-01-28 NOTE — ED Provider Notes (Signed)
[facility]-EMERGENCY DEPT Provider Note   CSN: [id number] Arrival date & time: 01/28/17  1635     History   Chief Complaint Chief Complaint  Patient presents with  . Abdominal Pain    HPI Donna Copeland is a 46 y.o. female doing a great job who presents the emergency department chief complaint of right upper quadrant abdominal pain.  Her pain began yesterday.  It has been intractable.  She states that it is currently 5 out of 10 but when she touches the right upper quadrant it is 8 or 9 out of 10.  She does not have nausea because she has been taking Dramamine.  She denies fevers or chills.  She has had no vomiting.  She denies any urinary symptoms, back pain, cough, chest pain, shortness of breath.  She has a previous history of appendectomy.  HPI  Past Medical History:  Diagnosis Date  . PONV (postoperative nausea and vomiting)   . Sinus congestion   . Vertigo   . Vestibular neuronitis     Patient Active Problem List   Diagnosis Date Noted  . Vertigo 12/01/2012  . Tobacco use disorder 12/01/2012  . Vestibular neuronitis     Past Surgical History:  Procedure Laterality Date  . APPENDECTOMY    . BREAST ENHANCEMENT SURGERY    . KNEE SURGERY    . left knee arthroscopy    . NASAL SINUS SURGERY     march 2014  . TONSILLECTOMY      OB History    No data available       Home Medications    Prior to Admission medications   Medication Sig Start Date End Date Taking? Authorizing Provider  azelastine (ASTELIN) 0.1 % nasal spray Place 2 sprays daily into both nostrils. 10/24/16  Yes [provider]  dimenhyDRINATE (DRAMAMINE) 50 MG tablet Take 50 mg by mouth every 8 (eight) hours as needed.   Yes [provider]  esomeprazole (NEXIUM) 20 MG capsule Take 20 mg daily as needed by mouth.   Yes [provider]  NONFORMULARY OR COMPOUNDED ITEM Apply 0.25 g daily topically. 4 %Testosterone  and 50 mcg Estradiol   Yes  [provider]  NONFORMULARY OR COMPOUNDED ITEM Apply 1 g daily topically. 20% PROGESTERONE CREAM   Yes [provider]  PRESCRIPTION MEDICATION Take 10 mg daily as needed by mouth (pain). Congoan[place] Scopolamine 10 mg   Yes [provider]  amoxicillin-clavulanate (AUGMENTIN) 400-57 MG/5ML suspension Take 10.9 mLs by mouth every 12 (twelve) hours. Patient not taking: Reported on 01/28/2017 12/02/12   Kathlen ModyAkula, Vijaya, MD  guaiFENesin (MUCINEX) 600 MG 12 hr tablet Take 1 tablet (600 mg total) by mouth 2 (two) times daily. Patient not taking: Reported on 01/28/2017 12/02/12   Kathlen ModyAkula, Vijaya, MD  meclizine (ANTIVERT) 50 MG tablet Take 1 tablet (50 mg total) by mouth 3 (three) times daily as needed for dizziness. Patient not taking: Reported on 01/28/2017 12/01/12   Loren RacerYelverton, David, MD  predniSONE (DELTASONE) 20 MG tablet Prednisone 60 mg daily for 4 days followed by Prednisone 40 mg daily for 1 days followed by Prednisone 30 mg daily for 1 day followed by Prednisone 20 mg daily for 1 day followed by Prednisone 10 mg daily for 1 day followed by  Prednisone 5 mg daily for 1 day. Patient not taking: Reported on 01/28/2017 12/02/12   Kathlen ModyAkula, Vijaya, MD    Family History Family History  Adopted: Yes    Social  History Social History   Tobacco Use  . Smoking status: Current Every Day Smoker    Packs/day: 0.50    Types: Cigarettes  . Smokeless tobacco: Never Used  Substance Use Topics  . Alcohol use: Yes    Alcohol/week: 2.4 oz    Types: 4 Glasses of wine per week  . Drug use: Not on file     Allergies   Sulfa antibiotics and Brussels sprouts [brassica oleracea italica]   Review of Systems Review of Systems Ten systems reviewed and are negative for acute change, except as noted in the HPI.    Physical Exam Updated Vital Signs BP (!) 142/87 (BP Location: Left Arm)   Pulse 88   Temp 98.1 F (36.7 C) (Oral)   Resp 18   Ht 5\' 6"  (1.676 m)   Wt 62.1 kg (137  lb)   SpO2 97%   BMI 22.11 kg/m   Physical Exam  Constitutional: She is oriented to person, place, and time. She appears well-developed and well-nourished. No distress.  HENT:  Head: Normocephalic and atraumatic.  Eyes: Conjunctivae are normal. No scleral icterus.  Neck: Normal range of motion.  Cardiovascular: Normal rate, regular rhythm and normal heart sounds. Exam reveals no gallop and no friction rub.  No murmur heard. Pulmonary/Chest: Effort normal and breath sounds normal. No respiratory distress.  Abdominal: Soft. Bowel sounds are normal. She exhibits no distension and no mass. There is tenderness in the right upper quadrant. There is no guarding.    Neurological: She is alert and oriented to person, place, and time.  Skin: Skin is warm and dry. She is not diaphoretic.  Psychiatric: Her behavior is normal.  Nursing note and vitals reviewed.    ED Treatments / Results  Labs (all labs ordered are listed, but only abnormal results are displayed) Labs Reviewed  COMPREHENSIVE METABOLIC PANEL - Abnormal; Notable for the following components:      Result Value   Glucose, Bld 148 (*)    Creatinine, Ser 1.18 (*)    GFR calc non Af Amer 54 (*)    All other components within normal limits  CBC - Abnormal; Notable for the following components:   RBC 5.23 (*)    Hemoglobin 16.7 (*)    HCT 49.6 (*)    All other components within normal limits  LIPASE, BLOOD  URINALYSIS, ROUTINE W REFLEX MICROSCOPIC  I-STAT BETA HCG BLOOD, ED ([facility], [facility], [facility] ONLY)    EKG  EKG Interpretation None       Radiology No results found.  Procedures Procedures (including critical care time)  Medications Ordered in ED Medications - No data to display   Initial Impression / Assessment and Plan / ED Course  I have reviewed the triage vital signs and the nursing notes.  Pertinent labs & imaging results that were available during my care of the patient were reviewed by me and considered in my  medical decision making (see chart for details).   Patient abdominal ultrasound is negative for any abnormality.  Patient seen and shared visit with attending physician feel she is safe for discharge.  Patient is reliable and will return for any worsening condition for potential CT scan.  Her lab work is reviewed and without significant abnormality.  Patient does have slightly elevated creatinine is advised to follow-up.  She appears safe for discharge at this time    Final Clinical Impressions(s) / ED Diagnoses   Final diagnoses:  RUQ abdominal pain    ED  Discharge Orders    None       Arthor CaptainHarris, Darrell Hauk, PA-C 01/29/17 [id number]    Doug SouJacubowitz, Sam, MD 01/29/17 [phone]

## 2017-01-28 NOTE — ED Provider Notes (Signed)
Complains pain is not made of right upper quadrant pain onset upon awakening this morning.  She denies fever denies nausea or vomiting.  Pain is improving with time, without treatment.  Pain is constant.  Not made better or worse by anything.  On exam she is in no distress lungs clear to auscultation abdomen nondistended normal active bowel sounds tender at right upper quadrant without guarding rigidity or rebound.  I explained the patient stay on clear liquid diet for the next 12 hours to return to the emergency department if pain worsens.  She is in agreement.  No further imaging needed.   Doug SouJacubowitz, Amarion Portell, MD 01/28/17 2156

## 2017-01-28 NOTE — ED Triage Notes (Signed)
Patient c/o RUQ pain that has been constant since yesterday. Patient reports takes nausea tablets and denies any vomiting or diarrhea.

## 2017-12-15 IMAGING — US US ABDOMEN COMPLETE
1 series · 14 of 25 positions shown · non-contrast
Comparison: None.

CLINICAL DATA: 38-year-old female with right upper quadrant
abdominal pain.

EXAM:
ABDOMEN ULTRASOUND COMPLETE

[Series 1: us abdomen complete · 0.17mm/px · 14 of 94 slices shown]
[im 1/94]
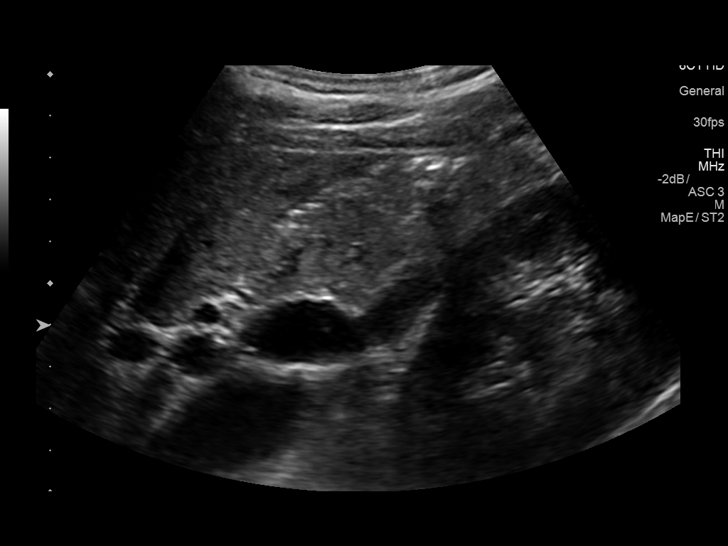
[im 8/94]
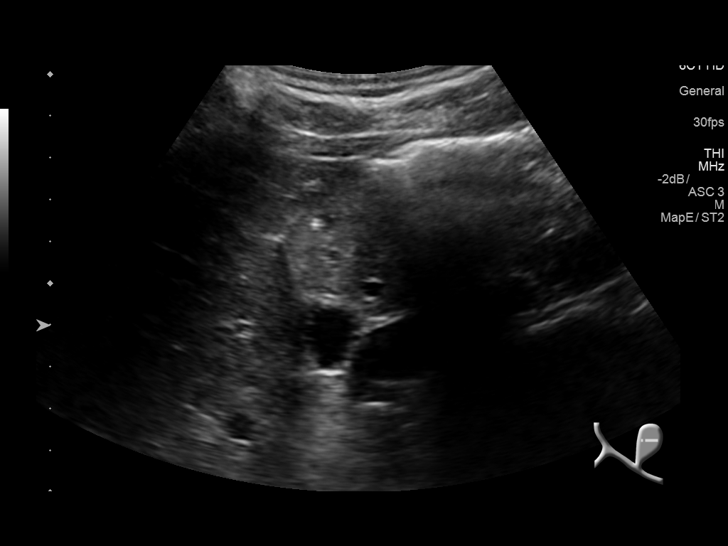
[im 16/94]
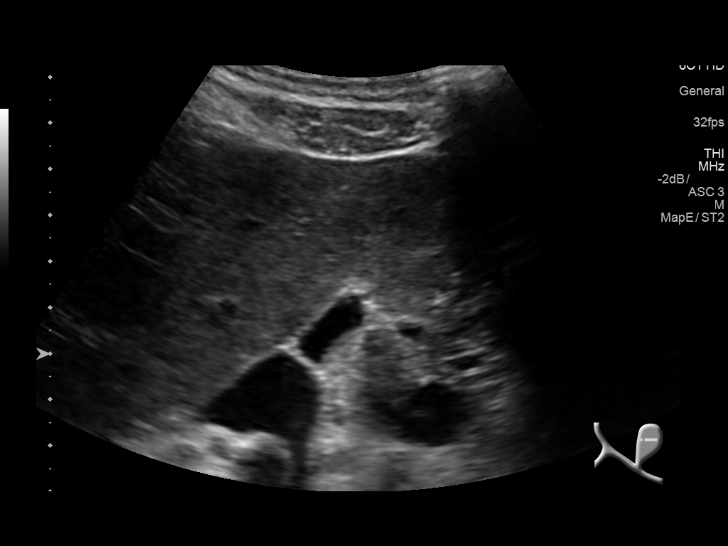
[im 24/94]
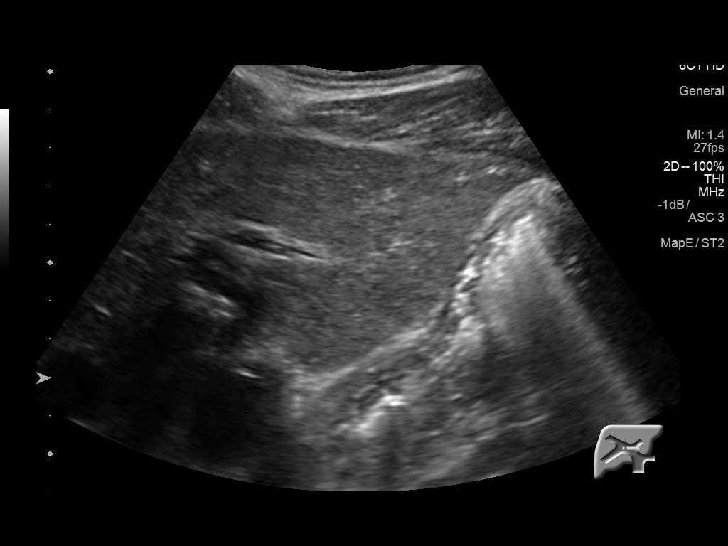
[im 32/94]
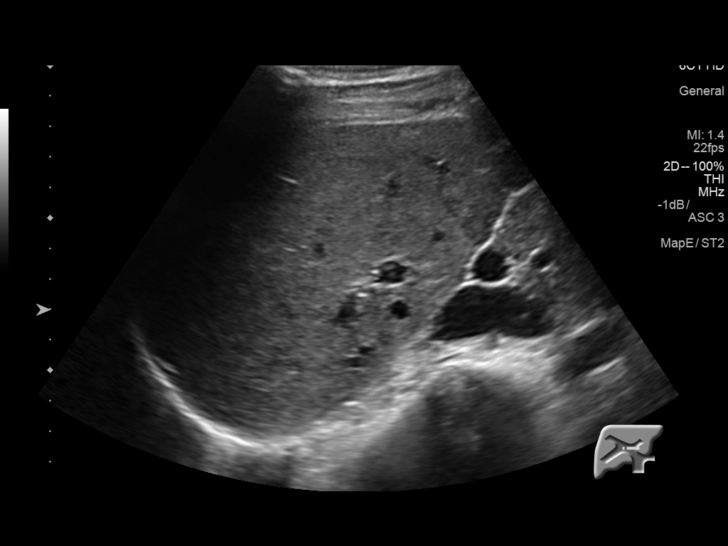
[im 35/94]
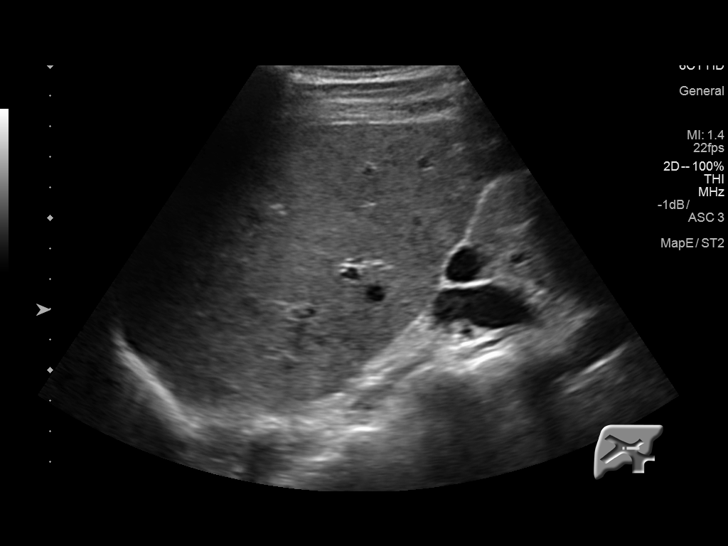
[im 43/94]
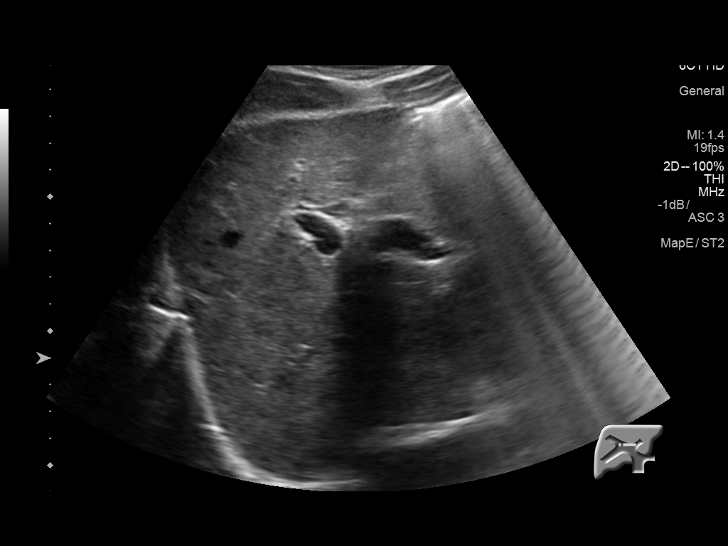
[im 51/94]
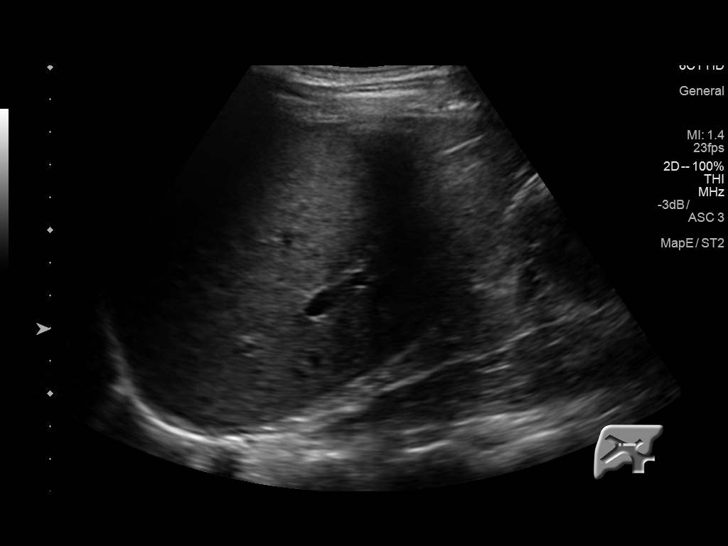
[im 59/94]
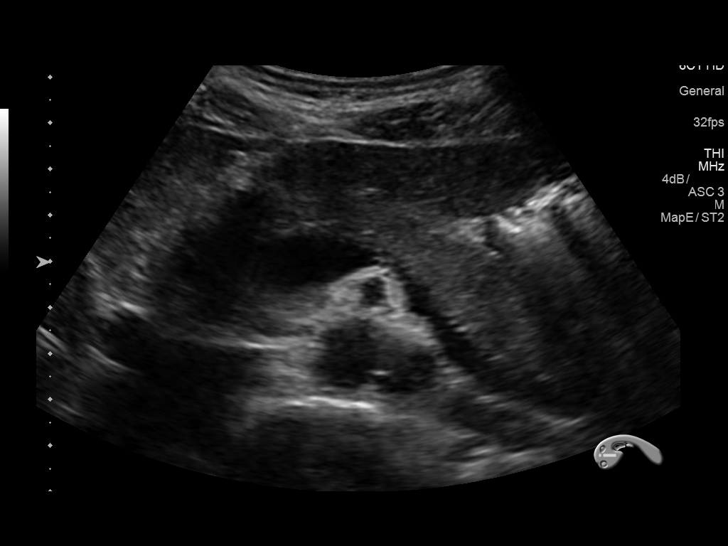
[im 63/94]
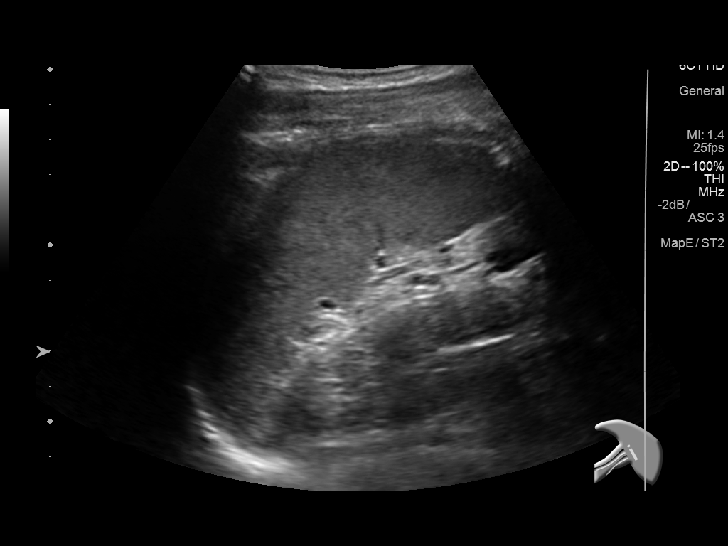
[im 70/94]
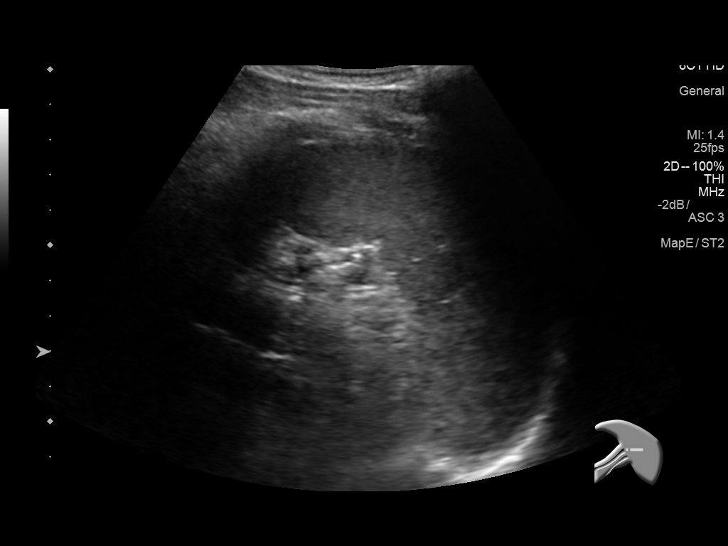
[im 78/94]
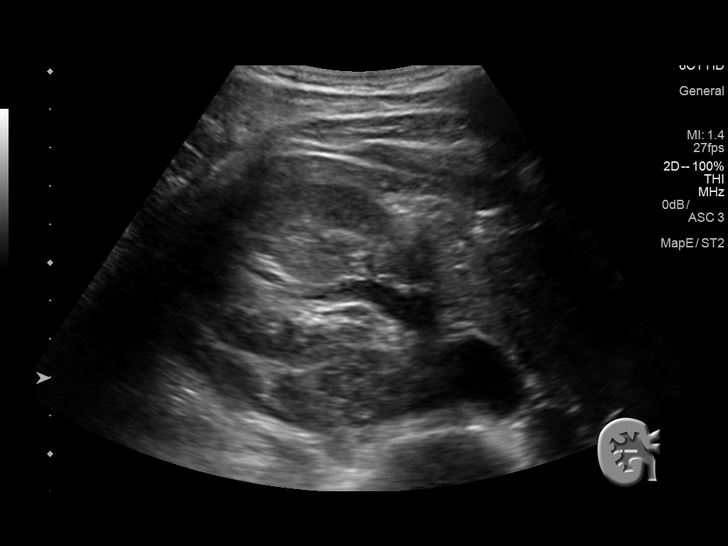
[im 86/94]
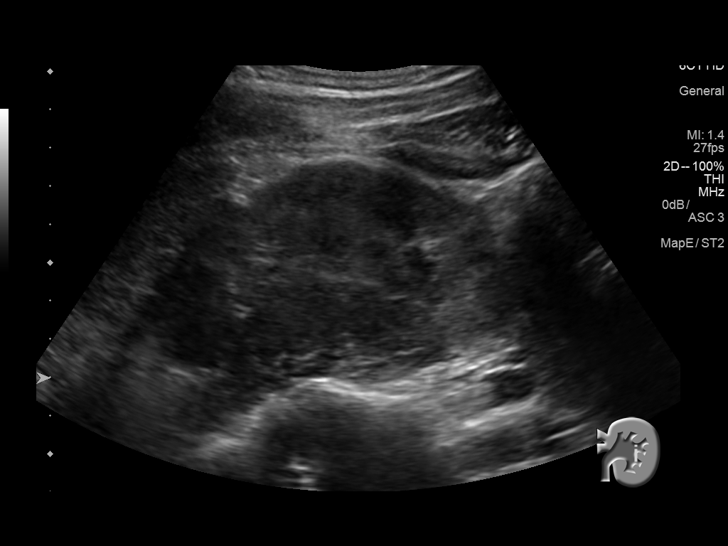
[im 94/94]
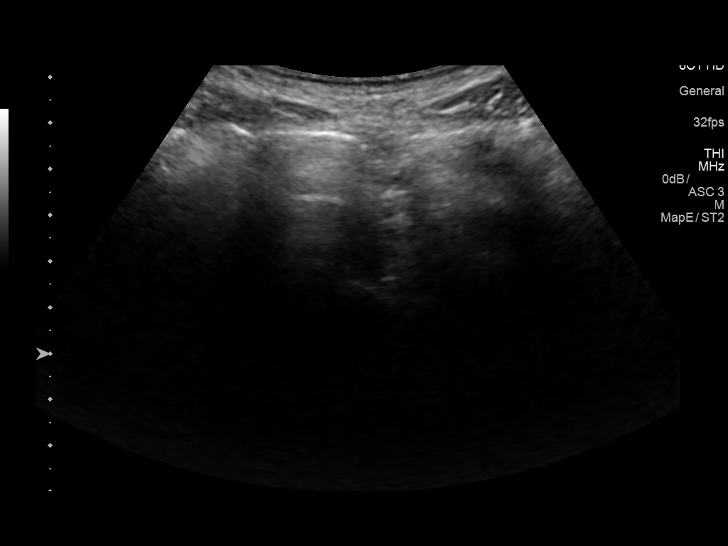

[14 of 25 positions shown; findings below may reference images not displayed]

FINDINGS: Gallbladder: No gallstones or wall thickening visualized. No
sonographic Murphy sign noted by sonographer.

Common bile duct: Diameter: 5 mm

Liver: The liver demonstrates a normal echogenicity. No intrahepatic
biliary ductal dilatation. Portal vein is patent on color Doppler
imaging with normal direction of blood flow towards the liver.

IVC: No abnormality visualized.

Pancreas: Visualized portion unremarkable.

Spleen: Size and appearance within normal limits.

Right Kidney: Length: 9.9 cm. Normal echogenicity. No hydronephrosis
or shadowing stone.

Left Kidney: Length: 10.9 cm. Normal echogenicity. No hydronephrosis
or shadowing stone.

Abdominal aorta: The visualized portions appear unremarkable.

Other findings: None.
IMPRESSION: Unremarkable abdominal ultrasound.

## 2022-02-06 ENCOUNTER — Other Ambulatory Visit (HOSPITAL_BASED_OUTPATIENT_CLINIC_OR_DEPARTMENT_OTHER): Payer: Self-pay

## 2022-02-06 ENCOUNTER — Other Ambulatory Visit: Payer: Self-pay

## 2022-02-06 ENCOUNTER — Emergency Department (HOSPITAL_BASED_OUTPATIENT_CLINIC_OR_DEPARTMENT_OTHER): Payer: BC Managed Care – PPO

## 2022-02-06 ENCOUNTER — Encounter (HOSPITAL_BASED_OUTPATIENT_CLINIC_OR_DEPARTMENT_OTHER): Payer: Self-pay

## 2022-02-06 ENCOUNTER — Emergency Department (HOSPITAL_BASED_OUTPATIENT_CLINIC_OR_DEPARTMENT_OTHER)
Admission: EM | Admit: 2022-02-06 | Discharge: 2022-02-06 | Disposition: A | Discharge status: Home / Self Care | Payer: BC Managed Care – PPO | Attending: Emergency Medicine | Admitting: Emergency Medicine

## 2022-02-06 DIAGNOSIS — R55 Syncope and collapse: Secondary | ICD-10-CM | POA: Diagnosis not present

## 2022-02-06 DIAGNOSIS — R42 Dizziness and giddiness: Secondary | ICD-10-CM | POA: Insufficient documentation

## 2022-02-06 LAB — CBC WITH DIFFERENTIAL/PLATELET
Abs Immature Granulocytes: 0.03 10*3/uL (ref 0.00–0.07)
Basophils Absolute: 0.1 10*3/uL (ref 0.0–0.1)
Basophils Relative: 1 %
Eosinophils Absolute: 0.1 10*3/uL (ref 0.0–0.5)
Eosinophils Relative: 1 %
HCT: 51.8 % — ABNORMAL HIGH (ref 36.0–46.0)
Hemoglobin: 16.4 g/dL — ABNORMAL HIGH (ref 12.0–15.0)
Immature Granulocytes: 0 %
Lymphocytes Relative: 17 %
Lymphs Abs: 1.5 10*3/uL (ref 0.7–4.0)
MCH: 26.4 pg (ref 26.0–34.0)
MCHC: 31.7 g/dL (ref 30.0–36.0)
MCV: 83.4 fL (ref 80.0–100.0)
Monocytes Absolute: 0.5 10*3/uL (ref 0.1–1.0)
Monocytes Relative: 6 %
Neutro Abs: 6.6 10*3/uL (ref 1.7–7.7)
Neutrophils Relative %: 75 %
Platelets: 204 10*3/uL (ref 150–400)
RBC: 6.21 MIL/uL — ABNORMAL HIGH (ref 3.87–5.11)
RDW: 20.1 % — ABNORMAL HIGH (ref 11.5–15.5)
WBC: 8.9 10*3/uL (ref 4.0–10.5)
nRBC: 0 % (ref 0.0–0.2)

## 2022-02-06 LAB — URINALYSIS, ROUTINE W REFLEX MICROSCOPIC
Bilirubin Urine: NEGATIVE
Glucose, UA: NEGATIVE mg/dL
Hgb urine dipstick: NEGATIVE
Ketones, ur: NEGATIVE mg/dL
Nitrite: NEGATIVE
Protein, ur: NEGATIVE mg/dL
Specific Gravity, Urine: 1.018 (ref 1.005–1.030)
pH: 6 (ref 5.0–8.0)

## 2022-02-06 LAB — BASIC METABOLIC PANEL
Anion gap: 12 (ref 5–15)
BUN: 20 mg/dL (ref 6–20)
CO2: 25 mmol/L (ref 22–32)
Calcium: 9.6 mg/dL (ref 8.9–10.3)
Chloride: 104 mmol/L (ref 98–111)
Creatinine, Ser: 1.08 mg/dL — ABNORMAL HIGH (ref 0.44–1.00)
GFR, Estimated: >60 mL/min (ref >60)
Glucose, Bld: 101 mg/dL — ABNORMAL HIGH (ref 70–99)
Potassium: 3.9 mmol/L (ref 3.5–5.1)
Sodium: 141 mmol/L (ref 135–145)

## 2022-02-06 LAB — TROPONIN I (HIGH SENSITIVITY)
Troponin I (High Sensitivity): 4 ng/L (ref <18)
Troponin I (High Sensitivity): 4 ng/L (ref <18)

## 2022-02-06 MED ORDER — DIAZEPAM 5 MG/ML IJ SOLN
5.0000 mg | Freq: Once | INTRAMUSCULAR | Status: AC
  Administered 2022-02-06: 5 mg via INTRAVENOUS
  Filled 2022-02-06: qty 2

## 2022-02-06 MED ORDER — ONDANSETRON HCL 4 MG PO TABS
4.0000 mg | ORAL_TABLET | Freq: Four times a day (QID) | ORAL | 0 refills | Status: AC | PRN
  Filled 2022-02-06: qty 20, 30d supply, fill #0

## 2022-02-06 MED ORDER — MECLIZINE HCL 25 MG PO TABS
25.0000 mg | ORAL_TABLET | Freq: Three times a day (TID) | ORAL | 0 refills | Status: AC | PRN
  Filled 2022-02-06: qty 30, 10d supply, fill #0

## 2022-02-06 MED ORDER — SCOPOLAMINE 1 MG/3DAYS TD PT72
1.0000 | MEDICATED_PATCH | TRANSDERMAL | 0 refills | Status: AC
  Filled 2022-02-06: qty 10, 30d supply, fill #0

## 2022-02-06 MED ORDER — DIAZEPAM 5 MG PO TABS
5.0000 mg | ORAL_TABLET | Freq: Three times a day (TID) | ORAL | 0 refills | Status: AC | PRN
  Filled 2022-02-06: qty 10, 4d supply, fill #0

## 2022-02-06 NOTE — ED Provider Notes (Signed)
[facility] EMERGENCY DEPT Provider Note   CSN: [id number] Arrival date & time: 02/06/22  1209     History  Chief Complaint  Patient presents with   Dizziness    Donna Copeland is a 51 y.o. female.  HPI Patient reports at about 530 this morning she got out of bed to go to the bathroom.  She reports normally she feels very well all the time.  She is active and does not have significant medical problems.  Reports she got into the bathroom and then inexplicably fainted and passed out.  She denies any preceding symptoms.  She did not note headache or chest pain.  She did not feel shortness of breath.  Reports it all happened very quickly.  She reports she was very anxious after it happened and she called her husband.  She was not having any symptoms of pain or injury.  She reports later that morning she then got an episode of vertigo.  She reports the spinning dizziness was intense.  She has had a prior episode of vertigo and evaluation.  She has an old prescription for meclizine, Valium and Zofran.  She reports she took these medications and started to get improvement.  She reports now she can sit with her head still and stare straight ahead.  Previously any eye movements or head movement elicited severe symptoms.  Association with this was nausea but no visual loss or double vision and no headache.  Also did not perceive any focal weakness numbness tingling or incoordination.    Home Medications Prior to Admission medications   Medication Sig Start Date End Date Taking? Authorizing Provider  diazepam (VALIUM) 5 MG tablet Take 1 tablet (5 mg total) by mouth every 8 (eight) hours as needed (vertigo). 02/06/22  Yes Arby Barrette, MD  meclizine (ANTIVERT) 25 MG tablet Take 1 tablet (25 mg total) by mouth 3 (three) times daily as needed for dizziness. 02/06/22  Yes Arby Barrette, MD  ondansetron (ZOFRAN) 4 MG tablet Take 1 tablet (4 mg total) by mouth 4 (four) times daily as  needed for nausea or vomiting. 02/06/22  Yes Arby Barrette, MD  scopolamine (TRANSDERM-SCOP) 1 MG/3DAYS Place 1 patch (1.5 mg total) onto the skin every 3 (three) days. 02/06/22  Yes Arby Barrette, MD  amoxicillin-clavulanate (AUGMENTIN) 400-57 MG/5ML suspension Take 10.9 mLs by mouth every 12 (twelve) hours. Patient not taking: Reported on 01/28/2017 12/02/12   Kathlen Mody, MD  azelastine (ASTELIN) 0.1 % nasal spray Place 2 sprays daily into both nostrils. 10/24/16   [provider]  dimenhyDRINATE (DRAMAMINE) 50 MG tablet Take 50 mg by mouth every 8 (eight) hours as needed.    [provider]  esomeprazole (NEXIUM) 20 MG capsule Take 20 mg daily as needed by mouth.    [provider]  guaiFENesin (MUCINEX) 600 MG 12 hr tablet Take 1 tablet (600 mg total) by mouth 2 (two) times daily. Patient not taking: Reported on 01/28/2017 12/02/12   Kathlen Mody, MD  meclizine (ANTIVERT) 50 MG tablet Take 1 tablet (50 mg total) by mouth 3 (three) times daily as needed for dizziness. Patient not taking: Reported on 01/28/2017 12/01/12   Loren Racer, MD  NONFORMULARY OR COMPOUNDED ITEM Apply 0.25 g daily topically. 4 %Testosterone  and 50 mcg Estradiol    [provider]  NONFORMULARY OR COMPOUNDED ITEM Apply 1 g daily topically. 20% PROGESTERONE CREAM    [provider]  predniSONE (DELTASONE) 20 MG tablet Prednisone 60 mg daily  for 4 days followed by Prednisone 40 mg daily for 1 days followed by Prednisone 30 mg daily for 1 day followed by Prednisone 20 mg daily for 1 day followed by Prednisone 10 mg daily for 1 day followed by  Prednisone 5 mg daily for 1 day. Patient not taking: Reported on 01/28/2017 12/02/12   Kathlen Mody, MD  PRESCRIPTION MEDICATION Take 10 mg daily as needed by mouth (pain). [place] Scopolamine 10 mg    [provider]      Allergies    Sulfa antibiotics and Brussels sprouts [brassica oleracea]    Review of Systems    Review of Systems  Physical Exam Updated Vital Signs BP (!) 135/94   Pulse 98   Temp 98.9 F (37.2 C) (Oral)   Resp 17   Ht 5\' 6"  (1.676 m)   Wt 62.1 kg   SpO2 98%   BMI 22.10 kg/m  Physical Exam Constitutional:      Appearance: Normal appearance.     Comments: Well-nourished well-developed.  Very good physical condition.  On first assessment patient is very vertiginous and needs to keep eyes focused straight ahead.  She is sitting up in the stretcher well in appearance.  Any movements of the eyes or head however trigger significant vertigo symptoms  HENT:     Ears:     Comments: Small amounts of cerumen in both ear canals.  TMs are partially visualized and not erythematous or bulging.    Nose: Nose normal.     Mouth/Throat:     Mouth: Mucous membranes are moist.     Pharynx: Oropharynx is clear.  Eyes:     Extraocular Movements: Extraocular movements intact.     Pupils: Pupils are equal, round, and reactive to light.  Cardiovascular:     Rate and Rhythm: Normal rate and regular rhythm.  Pulmonary:     Effort: Pulmonary effort is normal.     Breath sounds: Normal breath sounds.  Abdominal:     General: There is no distension.     Palpations: Abdomen is soft.     Tenderness: There is no abdominal tenderness. There is no guarding.  Musculoskeletal:        General: Normal range of motion.     Cervical back: Neck supple.  Skin:    General: Skin is warm and dry.  Neurological:     Mental Status: She is alert.     Comments: First exam patient is very vertiginous with head movement or eye movement.  Eyes are conjugate but there is nystagmus.  She can perform finger-nose exam but it does trigger symptoms significantly.  No focal motor deficit or weakness.  No facial nerve palsy.  No visual field deficits.     ED Results / Procedures / Treatments   Labs (all labs ordered are listed, but only abnormal results are displayed) Labs Reviewed  BASIC METABOLIC PANEL - Abnormal;  Notable for the following components:      Result Value   Glucose, Bld 101 (*)    Creatinine, Ser 1.08 (*)    All other components within normal limits  CBC WITH DIFFERENTIAL/PLATELET - Abnormal; Notable for the following components:   RBC 6.21 (*)    Hemoglobin 16.4 (*)    HCT 51.8 (*)    RDW 20.1 (*)    All other components within normal limits  URINALYSIS, ROUTINE W REFLEX MICROSCOPIC - Abnormal; Notable for the following components:   Leukocytes,Ua SMALL (*)    All  other components within normal limits  TROPONIN I (HIGH SENSITIVITY)  TROPONIN I (HIGH SENSITIVITY)    EKG EKG Interpretation  Date/Time:  Friday February 06 2022 12:20:34 EST Ventricular Rate:  102 PR Interval:  124 QRS Duration: 78 QT Interval:  328 QTC Calculation: 427 R Axis:   90 Text Interpretation: Sinus tachycardia Rightward axis Borderline ECG No previous ECGs available normal, no old comparison Confirmed by Arby Barrette [phone]) on 02/06/2022 1:29:14 PM  Radiology No results found.  Procedures Procedures    Medications Ordered in ED Medications  diazepam (VALIUM) injection 5 mg (5 mg Intravenous Given 02/06/22 1304)    ED Course/ Medical Decision Making/ A&P                           Medical Decision Making Amount and/or Complexity of Data Reviewed Labs: ordered. Radiology: ordered.  Risk Prescription drug management.  Patient is very healthy at baseline and clinically well in appearance.  She does describe an episode of syncope followed by vertigo.  She does not endorse significant headache with this.  However with syncope followed by vertigo we will proceed with CT head.  She has had prior episode of vertigo and has older prescriptions for meclizine Valium and Zofran.  She has not had frequent episodes but 1 previous severe episode.  Otherwise healthy without significant risk factors for stroke.  Blood pressures are normal.  We will also proceed with basic lab work and administer  Valium for vertigo.  CT head images reviewed by myself and radiology interpretation reviewed no areas of acute findings, no hemorrhage no bony structural abnormalities.  Labs reviewed within normal limits except mild elevation in hemoglobin with possible mild hemoconcentration.  Patient reassessed.  She is alert and in good condition.  Vertigo is well controlled after administration of Valium.  She is able to move her head and eyes without being significantly symptomatic.  Does request scopolamine patch for home use.  At time of discharge patient is very comfortable and symptoms are well controlled.  We have reviewed strict return precautions.  Prescriptions are provided for maximal control of vertigo at home.  Reviewed follow-up with ENT and strict return precautions.        Final Clinical Impression(s) / ED Diagnoses Final diagnoses:  Vertigo  Syncope, unspecified syncope type    Rx / DC Orders ED Discharge Orders          Ordered    meclizine (ANTIVERT) 25 MG tablet  3 times daily PRN        02/06/22 1552    scopolamine (TRANSDERM-SCOP) 1 MG/3DAYS  every 72 hours        02/06/22 1552    diazepam (VALIUM) 5 MG tablet  Every 8 hours PRN        02/06/22 1552    ondansetron (ZOFRAN) 4 MG tablet  4 times daily PRN        02/06/22 1552              Arby Barrette, MD 02/09/22 1818

## 2022-02-06 NOTE — ED Provider Triage Note (Signed)
Emergency Medicine Provider Triage Evaluation Note  Donna Copeland , a 51 y.o. female  was evaluated in triage.  Pt complains of syncopal episode at 4 in the morning after going to the bathroom.  Patient reports she had been feeling very well up until this time and it was unanticipated.  Then later in the morning she developed intense vertigo.  Much worse with head movements.  No focal motor deficits.  Patient did take combination of meclizine Valium and Zofran with some improvement but not resolution of symptoms.  Review of Systems  Positive: Low back pain yesterday evening, returned this morning.  Caused patient to walk slumped over. Negative: No fevers, chills, cough, abdominal pain or vomiting  Physical Exam  BP (!) 144/95 (BP Location: Right Arm)   Pulse (!) 106   Temp 97.8 F (36.6 C) (Oral)   Resp 17   Ht 5\' 6"  (1.676 m)   Wt 62.1 kg   SpO2 100%   BMI 22.10 kg/m  Gen:   Awake, no distress mental status is clear.  No respiratory distress. Resp:  Normal effort  MSK:   Moves extremities without difficulty   Other:  Patient is an oriented with normal speech.  She can perform finger-nose exam accurately bilaterally.  Vertigo is stimulated by any eye or head movement.  No focal motor weakness.  Medical Decision Making  Medically screening exam initiated at 12:37 PM.  Appropriate orders placed.  Caylan Chenard was informed that the remainder of the evaluation will be completed by another provider, this initial triage assessment does not replace that evaluation, and the importance of remaining in the ED until their evaluation is complete.  Highest suspicion for peripheral vertigo but with syncopal episode will obtain CT head and lab work.  Will treat with Valium.  At this time no code stroke criteria.   Kenyon Ana, MD 02/06/22 1240

## 2022-02-06 NOTE — ED Triage Notes (Signed)
Pt states having vertigo since this morning. States blacking out this morning lasting a few minutes. States having severe lower back pain. Denies nausea and vomiting. Denies cardiac hx.

## 2022-02-06 NOTE — Discharge Instructions (Signed)
1.  Schedule a follow-up appointment with an ear nose throat specialist at [facility] ear nose throat. 2.  At this time your symptoms are most consistent with peripheral vertigo.  However, you must return immediately if you have any double vision, loss of vision, headache, unusual weakness numbness incoordination or confusion. 3.  You have been given prescriptions to treat vertigo at home.  Return if you cannot adequately manage your symptoms at home. 4.  At this time I suspect that your passing out episode was due to a drop in blood pressure that can occur when you get up very quickly from bed.  You did not experience any chest pain, headache or other warning symptoms.  Return immediately if you are getting chest pain, racing heart, bad headaches or other concerning symptoms.  Be very careful about standing up.  Take your time make sure that you not feel dizzy or lightheaded.  Sit down immediately and elevate your legs if you do.

## 2022-02-09 ENCOUNTER — Other Ambulatory Visit: Payer: Self-pay

## 2023-06-09 ENCOUNTER — Other Ambulatory Visit: Payer: Self-pay | Admitting: Family Medicine

## 2023-06-09 DIAGNOSIS — T8543XA Leakage of breast prosthesis and implant, initial encounter: Secondary | ICD-10-CM

## 2023-06-09 DIAGNOSIS — N63 Unspecified lump in unspecified breast: Secondary | ICD-10-CM

## 2023-06-20 ENCOUNTER — Ambulatory Visit
Admission: RE | Admit: 2023-06-20 | Discharge: 2023-06-20 | Disposition: A | Discharge status: Home / Self Care | Payer: Self-pay | Source: Ambulatory Visit | Attending: Family Medicine | Admitting: Family Medicine

## 2023-06-20 DIAGNOSIS — T8543XA Leakage of breast prosthesis and implant, initial encounter: Secondary | ICD-10-CM

## 2023-06-20 DIAGNOSIS — N63 Unspecified lump in unspecified breast: Secondary | ICD-10-CM

## 2023-06-28 ENCOUNTER — Other Ambulatory Visit: Payer: Self-pay | Admitting: Family Medicine

## 2023-06-28 DIAGNOSIS — N644 Mastodynia: Secondary | ICD-10-CM

## 2023-06-29 ENCOUNTER — Ambulatory Visit
Admission: RE | Admit: 2023-06-29 | Discharge: 2023-06-29 | Discharge status: Home / Self Care | Payer: Self-pay | Source: Ambulatory Visit | Attending: Family Medicine | Admitting: Family Medicine

## 2023-06-29 ENCOUNTER — Ambulatory Visit
Admission: RE | Admit: 2023-06-29 | Discharge: 2023-06-29 | Disposition: A | Discharge status: Home / Self Care | Payer: Self-pay | Source: Ambulatory Visit | Attending: Family Medicine | Admitting: Family Medicine

## 2023-06-29 DIAGNOSIS — T859XXA Unspecified complication of internal prosthetic device, implant and graft, initial encounter: Secondary | ICD-10-CM | POA: Insufficient documentation

## 2023-06-29 DIAGNOSIS — N644 Mastodynia: Secondary | ICD-10-CM

## 2023-07-06 LAB — SURGICAL PATHOLOGY
